# Patient Record
Sex: Male | Born: 1990 | Race: Black or African American | Hispanic: No | Marital: Single | Smoking: Current some day smoker
Health system: Southern US, Community
[De-identification: ages and names within clinical notes are randomized; demographics above are authoritative.]

## PROBLEM LIST (undated history)

## (undated) DIAGNOSIS — B019 Varicella without complication: Secondary | ICD-10-CM

## (undated) HISTORY — DX: Varicella without complication: B01.9

## (undated) HISTORY — PX: NO PAST SURGERIES: SHX2092

---
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
## 2010-05-14 ENCOUNTER — Emergency Department: Payer: Self-pay | Admitting: Emergency Medicine

## 2010-11-01 ENCOUNTER — Emergency Department: Payer: Self-pay | Admitting: Emergency Medicine

## 2011-09-27 ENCOUNTER — Emergency Department: Payer: Self-pay | Admitting: Emergency Medicine

## 2012-08-14 ENCOUNTER — Emergency Department: Payer: Self-pay | Admitting: Emergency Medicine

## 2013-10-17 ENCOUNTER — Emergency Department: Payer: Self-pay | Admitting: Internal Medicine

## 2015-06-10 ENCOUNTER — Emergency Department
Admission: EM | Admit: 2015-06-10 | Discharge: 2015-06-10 | Disposition: A | Discharge status: Home / Self Care | Payer: 59 | Attending: Emergency Medicine | Admitting: Emergency Medicine

## 2015-06-10 ENCOUNTER — Encounter: Payer: Self-pay | Admitting: Emergency Medicine

## 2015-06-10 DIAGNOSIS — R110 Nausea: Secondary | ICD-10-CM | POA: Insufficient documentation

## 2015-06-10 DIAGNOSIS — F17200 Nicotine dependence, unspecified, uncomplicated: Secondary | ICD-10-CM | POA: Insufficient documentation

## 2015-06-10 DIAGNOSIS — R1084 Generalized abdominal pain: Secondary | ICD-10-CM | POA: Diagnosis not present

## 2015-06-10 LAB — URINALYSIS COMPLETE WITH MICROSCOPIC (ARMC ONLY)
BACTERIA UA: NONE SEEN
Bilirubin Urine: NEGATIVE
Glucose, UA: NEGATIVE mg/dL
Hgb urine dipstick: NEGATIVE
Ketones, ur: NEGATIVE mg/dL
Leukocytes, UA: NEGATIVE
NITRITE: NEGATIVE
PROTEIN: NEGATIVE mg/dL
RBC / HPF: NONE SEEN RBC/hpf (ref 0–5)
Specific Gravity, Urine: 1.018 (ref 1.005–1.030)
pH: 6 (ref 5.0–8.0)

## 2015-06-10 LAB — COMPREHENSIVE METABOLIC PANEL
ALBUMIN: 4.3 g/dL (ref 3.5–5.0)
ALT: 26 U/L (ref 17–63)
AST: 20 U/L (ref 15–41)
Alkaline Phosphatase: 71 U/L (ref 38–126)
Anion gap: 6 (ref 5–15)
BILIRUBIN TOTAL: 1.3 mg/dL — AB (ref 0.3–1.2)
BUN: 12 mg/dL (ref 6–20)
CO2: 27 mmol/L (ref 22–32)
CREATININE: 0.81 mg/dL (ref 0.61–1.24)
Calcium: 9.5 mg/dL (ref 8.9–10.3)
Chloride: 105 mmol/L (ref 101–111)
GFR calc Af Amer: >60 mL/min (ref >60)
GFR calc non Af Amer: >60 mL/min (ref >60)
GLUCOSE: 100 mg/dL — AB (ref 65–99)
POTASSIUM: 4 mmol/L (ref 3.5–5.1)
Sodium: 138 mmol/L (ref 135–145)
TOTAL PROTEIN: 7.7 g/dL (ref 6.5–8.1)

## 2015-06-10 LAB — CBC
HEMATOCRIT: 45.5 % (ref 40.0–52.0)
Hemoglobin: 15.2 g/dL (ref 13.0–18.0)
MCH: 27.6 pg (ref 26.0–34.0)
MCHC: 33.5 g/dL (ref 32.0–36.0)
MCV: 82.6 fL (ref 80.0–100.0)
Platelets: 173 10*3/uL (ref 150–440)
RBC: 5.51 MIL/uL (ref 4.40–5.90)
RDW: 13.3 % (ref 11.5–14.5)
WBC: 5.4 10*3/uL (ref 3.8–10.6)

## 2015-06-10 LAB — LIPASE, BLOOD: Lipase: 27 U/L (ref 11–51)

## 2015-06-10 NOTE — ED Notes (Addendum)
Pt to ed with c/o abd pain, n/v/d and back pain x 3 months.  Pt appears in nad, well hydrated and skin warm and dry.

## 2015-06-10 NOTE — Discharge Instructions (Signed)
No certain cause was found for your nausea and abdominal pain, however your examination and evaluation in the emergency department are reassuring.  Return to the emergency department for any new or worsening condition including fever, worsening pain, black or bloody stool, blood in the urine, vomiting, or any other symptoms concerning to you.   Abdominal Pain, Adult Many things can cause belly (abdominal) pain. Most times, the belly pain is not dangerous. Many cases of belly pain can be watched and treated at home. HOME CARE   Do not take medicines that help you go poop (laxatives) unless told to by your doctor.  Only take medicine as told by your doctor.  Eat or drink as told by your doctor. Your doctor will tell you if you should be on a special diet. GET HELP IF:  You do not know what is causing your belly pain.  You have belly pain while you are sick to your stomach (nauseous) or have runny poop (diarrhea).  You have pain while you pee or poop.  Your belly pain wakes you up at night.  You have belly pain that gets worse or better when you eat.  You have belly pain that gets worse when you eat fatty foods.  You have a fever. GET HELP RIGHT AWAY IF:   The pain does not go away within 2 hours.  You keep throwing up (vomiting).  The pain changes and is only in the right or left part of the belly.  You have bloody or tarry looking poop. MAKE SURE YOU:   Understand these instructions.  Will watch your condition.  Will get help right away if you are not doing well or get worse.   This information is not intended to replace advice given to you by your health care provider. Make sure you discuss any questions you have with your health care provider.   Document Released: 12/29/2007 Document Revised: 08/02/2014 Document Reviewed: 03/21/2013 Elsevier Interactive Patient Education [employer]2016 [employer]vier Inc.

## 2015-06-10 NOTE — ED Provider Notes (Signed)
[facility] Emergency Department Provider Note   ____________________________________________  Time seen: 4 PM I have reviewed the triage vital signs and the triage nursing note.  HISTORY  Chief Complaint Abdominal Pain   Historian Patient  HPI Grant Bailey is a 24 y.o. male who is here for evaluation of nausea and generalized abdominal pain after leaving work due to symptoms today. Patient states he has had symptoms for months. He does not have a primary care doctor, and has seen nobody for this yet. He's had no vomiting or diarrhea. He has had no fever. He is not sure if anything aggravates the condition. He is also complaining of some low back bilateral discomfort that is made worse by lifting which she does at his job. No urinary symptoms. No weakness or numbness.    History reviewed. No pertinent past medical history.  There are no active problems to display for this patient.   History reviewed. No pertinent past surgical history.  No current outpatient prescriptions on file.  Allergies Review of patient's allergies indicates no known allergies.  History reviewed. No pertinent family history.  Social History Social History  Substance Use Topics  . Smoking status: Current Every Day Smoker  . Smokeless tobacco: None  . Alcohol Use: No    Review of Systems  Constitutional: Negative for fever. Eyes: Negative for visual changes. ENT: Negative for sore throat. Cardiovascular: Negative for chest pain. Respiratory: Negative for shortness of breath. Gastrointestinal: Negative for  vomiting and diarrhea. Genitourinary: Negative for dysuria. Musculoskeletal: Per history of present illness Skin: Negative for rash. Neurological: Negative for headache. 10 point Review of Systems otherwise negative ____________________________________________   PHYSICAL EXAM:  VITAL SIGNS: ED Triage Vitals  Enc Vitals Group     BP 06/10/15 1507 133/72  mmHg     Pulse Rate 06/10/15 1245 62     Resp 06/10/15 1245 20     Temp 06/10/15 1245 98.6 F (37 C)     Temp Source 06/10/15 1245 Oral     SpO2 06/10/15 1245 97 %     Weight 06/10/15 1245 266 lb (120.657 kg)     Height 06/10/15 1245  (1.676 m)     Head Cir --      Peak Flow --      Pain Score 06/10/15 1246 10     Pain Loc --      Pain Edu? --      Excl. in GC? --      Constitutional: Alert and oriented. Well appearing and in no distress. Eyes: Conjunctivae are normal. PERRL. Normal extraocular movements. ENT   Head: Normocephalic and atraumatic.   Nose: No congestion/rhinnorhea.   Mouth/Throat: Mucous membranes are moist.   Neck: No stridor. Cardiovascular/Chest: Normal rate, regular rhythm.  No murmurs, rubs, or gallops. Respiratory: Normal respiratory effort without tachypnea nor retractions. Breath sounds are clear and equal bilaterally. No wheezes/rales/rhonchi. Gastrointestinal: Soft. No distention, no guarding, no rebound. Nontender, mildly obese.  Genitourinary/rectal:Deferred Musculoskeletal: Nontender with normal range of motion in all extremities. No joint effusions.  No lower extremity tenderness.  No edema. Neurologic:  Normal speech and language. No gross or focal neurologic deficits are appreciated. Skin:  Skin is warm, dry and intact. No rash noted. Psychiatric: Mood and affect are normal. Speech and behavior are normal. Patient exhibits appropriate insight and judgment.  ____________________________________________   EKG I, Governor Rooks, MD, the attending physician have personally viewed and interpreted all ECGs.  None ____________________________________________  LABS (pertinent positives/negatives)  Lipase 27 Comprehensive metabolic panel within normal limits CBC within normal limits Urinalysis normal  ____________________________________________  RADIOLOGY All Xrays were viewed by me. Imaging interpreted by  Radiologist.  None __________________________________________  PROCEDURES  Procedure(s) performed: None  Critical Care performed: None  ____________________________________________   ED COURSE / ASSESSMENT AND PLAN  CONSULTATIONS: None  Pertinent labs & imaging results that were available during my care of the patient were reviewed by me and considered in my medical decision making (see chart for details).   Patient is overall well-appearing with a reassuring examination, vital signs, and laboratory evaluation. His symptoms have been ongoing now for several months, and sound like his abdominal pains and nausea may be related to irritable bowel syndrome. I've asked him to follow with primary care physician, he may end up needing a gastroenterology referral. No imaging indicated from an emergency standpoint.  In terms of his low back pain, this sounds musculoskeletal in nature given the fact that he listened stoops. Conservative management in terms of this.  Patient / Family / Caregiver informed of clinical course, medical decision-making process, and agree with plan.   I discussed return precautions, follow-up instructions, and discharged instructions with patient and/or family.  ___________________________________________   FINAL CLINICAL IMPRESSION(S) / ED DIAGNOSES   Final diagnoses:  Nausea  Abdominal pain, generalized       Governor Rooksebecca Ashlyn Cabler, MD 06/10/15 1630

## 2015-08-18 ENCOUNTER — Emergency Department
Admission: EM | Admit: 2015-08-18 | Discharge: 2015-08-18 | Disposition: A | Discharge status: Home / Self Care | Payer: 59 | Attending: Emergency Medicine | Admitting: Emergency Medicine

## 2015-08-18 ENCOUNTER — Encounter: Payer: Self-pay | Admitting: Emergency Medicine

## 2015-08-18 DIAGNOSIS — R197 Diarrhea, unspecified: Secondary | ICD-10-CM | POA: Diagnosis present

## 2015-08-18 DIAGNOSIS — K529 Noninfective gastroenteritis and colitis, unspecified: Secondary | ICD-10-CM | POA: Insufficient documentation

## 2015-08-18 DIAGNOSIS — F17200 Nicotine dependence, unspecified, uncomplicated: Secondary | ICD-10-CM | POA: Diagnosis not present

## 2015-08-18 LAB — COMPREHENSIVE METABOLIC PANEL
ALK PHOS: 72 U/L (ref 38–126)
ALT: 30 U/L (ref 17–63)
AST: 18 U/L (ref 15–41)
Albumin: 4.9 g/dL (ref 3.5–5.0)
Anion gap: 5 (ref 5–15)
BUN: 9 mg/dL (ref 6–20)
CALCIUM: 9.6 mg/dL (ref 8.9–10.3)
CO2: 31 mmol/L (ref 22–32)
CREATININE: 0.72 mg/dL (ref 0.61–1.24)
Chloride: 103 mmol/L (ref 101–111)
GFR calc non Af Amer: >60 mL/min (ref >60)
Glucose, Bld: 94 mg/dL (ref 65–99)
Potassium: 3.9 mmol/L (ref 3.5–5.1)
SODIUM: 139 mmol/L (ref 135–145)
Total Bilirubin: 1.2 mg/dL (ref 0.3–1.2)
Total Protein: 8 g/dL (ref 6.5–8.1)

## 2015-08-18 LAB — URINALYSIS COMPLETE WITH MICROSCOPIC (ARMC ONLY)
BACTERIA UA: NONE SEEN
BILIRUBIN URINE: NEGATIVE
GLUCOSE, UA: NEGATIVE mg/dL
HGB URINE DIPSTICK: NEGATIVE
KETONES UR: NEGATIVE mg/dL
Leukocytes, UA: NEGATIVE
NITRITE: NEGATIVE
Protein, ur: NEGATIVE mg/dL
RBC / HPF: NONE SEEN RBC/hpf (ref 0–5)
SPECIFIC GRAVITY, URINE: 1.004 — AB (ref 1.005–1.030)
pH: 7 (ref 5.0–8.0)

## 2015-08-18 LAB — CBC WITH DIFFERENTIAL/PLATELET
BASOS PCT: 1 %
Basophils Absolute: 0 10*3/uL (ref 0–0.1)
EOS ABS: 0.1 10*3/uL (ref 0–0.7)
EOS PCT: 1 %
HCT: 46.4 % (ref 40.0–52.0)
Hemoglobin: 15.4 g/dL (ref 13.0–18.0)
Lymphocytes Relative: 37 %
Lymphs Abs: 2.3 10*3/uL (ref 1.0–3.6)
MCH: 27 pg (ref 26.0–34.0)
MCHC: 33.1 g/dL (ref 32.0–36.0)
MCV: 81.5 fL (ref 80.0–100.0)
MONO ABS: 0.4 10*3/uL (ref 0.2–1.0)
MONOS PCT: 6 %
Neutro Abs: 3.5 10*3/uL (ref 1.4–6.5)
Neutrophils Relative %: 55 %
PLATELETS: 186 10*3/uL (ref 150–440)
RBC: 5.7 MIL/uL (ref 4.40–5.90)
RDW: 13.7 % (ref 11.5–14.5)
WBC: 6.3 10*3/uL (ref 3.8–10.6)

## 2015-08-18 LAB — LIPASE, BLOOD: Lipase: 22 U/L (ref 11–51)

## 2015-08-18 MED ORDER — DIPHENOXYLATE-ATROPINE 2.5-0.025 MG PO TABS
2.0000 | ORAL_TABLET | Freq: Once | ORAL | Status: AC
  Administered 2015-08-18: 2 via ORAL

## 2015-08-18 MED ORDER — ONDANSETRON HCL 4 MG/2ML IJ SOLN
4.0000 mg | Freq: Once | INTRAMUSCULAR | Status: AC
  Administered 2015-08-18: 4 mg via INTRAVENOUS
  Filled 2015-08-18: qty 2

## 2015-08-18 MED ORDER — SODIUM CHLORIDE 0.9 % IV BOLUS (SEPSIS)
1000.0000 mL | Freq: Once | INTRAVENOUS | Status: AC
  Administered 2015-08-18: 1000 mL via INTRAVENOUS

## 2015-08-18 MED ORDER — ONDANSETRON 4 MG PO TBDP
4.0000 mg | ORAL_TABLET | Freq: Once | ORAL | Status: AC
  Administered 2015-08-18: 4 mg via ORAL
  Filled 2015-08-18: qty 1

## 2015-08-18 MED ORDER — ONDANSETRON HCL 8 MG PO TABS: 8.0000 mg | ORAL_TABLET | Freq: Three times a day (TID) | ORAL | Status: DC | PRN

## 2015-08-18 NOTE — ED Provider Notes (Signed)
----------------------------------------- [facility] Emergency Department Provider Note  ____________________________________________  Time seen: Approximately 5:33 PM  I have reviewed the triage vital signs and the nursing notes.   HISTORY  Chief Complaint Emesis and Diarrhea    HPI Grant Bailey is a 25 y.o. male assumed care of this patient from Georgia Triplett for complain of generalized abdominal pain.Patient is continue nausea vomiting diarrhea since 08/15/2015. Patient state she cannot tolerate fluids or fluid. Patient was given a fluid challenge after taking OTC Zofran and started vomiting. Patient is awaiting results of urine and blood tests. Patient states his pain has decreased from a 9/10 to 4/10. Initially patient stated the pain was generalized but now states the pain is in epigastric region.   History reviewed. No pertinent past medical history.  There are no active problems to display for this patient.   History reviewed. No pertinent past surgical history.  Current Outpatient Rx  Name  Route  Sig  Dispense  Refill  . ondansetron (ZOFRAN) 8 MG tablet   Oral   Take 1 tablet (8 mg total) by mouth every 8 (eight) hours as needed for nausea or vomiting.   20 tablet   0     Allergies Review of patient's allergies indicates no known allergies.  No family history on file.  Social History Social History  Substance Use Topics  . Smoking status: Current Every Day Smoker  . Smokeless tobacco: None  . Alcohol Use: Yes     Comment: occasional    Review of Systems Constitutional: No fever/chills Eyes: No visual changes. ENT: No sore throat. Cardiovascular: Denies chest pain. Respiratory: Denies shortness of breath. Gastrointestinal: Abdominal pain with nausea vomiting diarrhea No constipation. Genitourinary: Negative for dysuria. Musculoskeletal: Negative for back pain. Skin: Negative for rash. Neurological: Negative for headaches,  focal weakness or numbness. 10-point ROS otherwise negative.  ____________________________________________   PHYSICAL EXAM:  VITAL SIGNS: ED Triage Vitals  Enc Vitals Group     BP 08/18/15 1145 122/79 mmHg     Pulse Rate 08/18/15 1145 67     Resp 08/18/15 1145 18     Temp 08/18/15 1145 98.5 F (36.9 C)     Temp Source 08/18/15 1145 Oral     SpO2 08/18/15 1145 96 %     Weight 08/18/15 1145 260 lb (117.935 kg)     Height 08/18/15 1145  (1.676 m)     Head Cir --      Peak Flow --      Pain Score 08/18/15 1146 9     Pain Loc --      Pain Edu? --      Excl. in GC? --     Constitutional: Alert and oriented. Well appearing and in no acute distress. Eyes: Conjunctivae are normal. PERRL. EOMI. Head: Atraumatic. Nose: No congestion/rhinnorhea. Mouth/Throat: Mucous membranes are moist.  Oropharynx non-erythematous. Neck: No stridor. No cervical spine tenderness to palpation. Hematological/Lymphatic/Immunilogical: No cervical lymphadenopathy. Cardiovascular: Normal rate, regular rhythm. Grossly normal heart sounds.  Good peripheral circulation. Respiratory: Normal respiratory effort.  No retractions. Lungs CTAB. Gastrointestinal: Soft and nontender. No distention. No abdominal bruits. No CVA tenderness. Normoactive bowel sounds Musculoskeletal: No lower extremity tenderness nor edema.  No joint effusions. Neurologic:  Normal speech and language. No gross focal neurologic deficits are appreciated. No gait instability. Skin:  Skin is warm, dry and intact. No rash noted. Psychiatric: Mood and affect are normal. Speech and behavior are normal.  ____________________________________________  LABS (all labs ordered are listed, but only abnormal results are displayed)  Labs Reviewed  URINALYSIS COMPLETEWITH MICROSCOPIC ([facility] ONLY) - Abnormal; Notable for the following:    Color, Urine STRAW (*)    APPearance CLEAR (*)    Specific Gravity, Urine 1.004 (*)    Squamous Epithelial  / LPF 0-5 (*)    All other components within normal limits  CBC WITH DIFFERENTIAL/PLATELET  LIPASE, BLOOD  COMPREHENSIVE METABOLIC PANEL   ____________________________________________  EKG  ____________________________________________  RADIOLOGY   ____________________________________________   PROCEDURES  Procedure(s) performed: None  Critical Care performed: No  ____________________________________________   INITIAL IMPRESSION / ASSESSMENT AND PLAN / ED COURSE  Pertinent labs & imaging results that were available during my care of the patient were reviewed by me and considered in my medical decision making (see chart for details). Viral gastroenteritis  Patient passed food and fluid challenge. Discussed negative labs with patient. Patient given a prescription for Zofran. Patient given discharge care instructions. Patient given a work note for today and tomorrow. ____________________________________________   FINAL CLINICAL IMPRESSION(S) / ED DIAGNOSES  Final diagnoses:  Gastroenteritis      Joni Reining, PA-C 08/18/15 1832  Emily Filbert, MD 08/19/15 [phone]

## 2015-08-18 NOTE — ED Provider Notes (Signed)
[facility] Emergency Department Provider Note ____________________________________________  Time seen: Approximately 2:24 PM  I have reviewed the triage vital signs and the nursing notes.   HISTORY  Chief Complaint Emesis and Diarrhea   HPI Grant Bailey is a 25 y.o. male who presents to the emergency department for generalized abdominal pain that started on Friday night with nausea, vomiting, and diarrhea. He has not vomited since 9 AM this morning, but has not attempted to eat any solid food. He has been able to keep down some sips of water. He states that he has had diarrhea often throughout the day. He describes it as watery, and denies mucus or blood.He describes the abdominal pain as cramping just prior to vomiting.   History reviewed. No pertinent past medical history.  There are no active problems to display for this patient.   History reviewed. No pertinent past surgical history.  No current outpatient prescriptions on file.  Allergies Review of patient's allergies indicates no known allergies.  No family history on file.  Social History Social History  Substance Use Topics  . Smoking status: Current Every Day Smoker  . Smokeless tobacco: None  . Alcohol Use: Yes     Comment: occasional    Review of Systems Constitutional: No fever/chills Eyes: No visual changes. ENT: No sore throat. Cardiovascular: Denies chest pain. Respiratory: Denies shortness of breath. Gastrointestinal: Positive for abdominal pain.  Positive for nausea and vomiting.  Positive for diarrhea.  No constipation. Genitourinary: Negative for dysuria. Musculoskeletal: Negative for back pain. Skin: Negative for rash. Neurological: Negative for headaches, focal weakness or numbness.  10-point ROS otherwise negative.  ____________________________________________   PHYSICAL EXAM:  VITAL SIGNS: ED Triage Vitals  Enc Vitals Group     BP 08/18/15 1145 122/79 mmHg      Pulse Rate 08/18/15 1145 67     Resp 08/18/15 1145 18     Temp 08/18/15 1145 98.5 F (36.9 C)     Temp Source 08/18/15 1145 Oral     SpO2 08/18/15 1145 96 %     Weight 08/18/15 1145 260 lb (117.935 kg)     Height 08/18/15 1145  (1.676 m)     Head Cir --      Peak Flow --      Pain Score 08/18/15 1146 9     Pain Loc --      Pain Edu? --      Excl. in GC? --     Constitutional: Alert and oriented. Well appearing and in no acute distress. Eyes: Conjunctivae are normal. PERRL. EOMI. Head: Atraumatic. Nose: No congestion/rhinnorhea. Mouth/Throat: Mucous membranes are moist.  Oropharynx non-erythematous. Neck: No stridor.   Cardiovascular: Normal rate, regular rhythm. Grossly normal heart sounds.  Good peripheral circulation. Respiratory: Normal respiratory effort.  No retractions. Lungs CTAB. Gastrointestinal: Soft and nontender. No distention. No abdominal bruits. No CVA tenderness. Active bowel sounds 4 quadrants. Musculoskeletal: No lower extremity tenderness nor edema.  No joint effusions. Neurologic:  Normal speech and language. No gross focal neurologic deficits are appreciated. No gait instability. Skin:  Skin is warm, dry and intact. No rash noted. Psychiatric: Mood and affect are normal. Speech and behavior are normal.  ____________________________________________   LABS (all labs ordered are listed, but only abnormal results are displayed)  Labs Reviewed  CBC WITH DIFFERENTIAL/PLATELET  URINALYSIS COMPLETEWITH MICROSCOPIC ([facility] ONLY)  LIPASE, BLOOD  COMPREHENSIVE METABOLIC PANEL   ____________________________________________  EKG   ____________________________________________  RADIOLOGY  ____________________________________________   PROCEDURES  Procedure(s) performed: None  Critical Care performed: No  ____________________________________________   INITIAL IMPRESSION / ASSESSMENT AND PLAN / ED COURSE  Pertinent labs & imaging results  that were available during my care of the patient were reviewed by me and considered in my medical decision making (see chart for details).  ----------------------------------------- 4:12 PM on 08/18/2015 -----------------------------------------  Patient was given Zofran ODT and then by mouth challenge. He vomited after drinking some ice water. We will order labs. Care will be transferred to run Tyro, PA-C at this time. ____________________________________________   FINAL CLINICAL IMPRESSION(S) / ED DIAGNOSES  Final diagnoses:  None      Chinita Pester, FNP 08/18/15 1613  Sharman Cheek, MD 08/19/15 2118

## 2015-08-18 NOTE — ED Notes (Addendum)
Pt was not in room when provider went to assess pt at 1432. Pt is in room when RN checked and provider has been made aware.

## 2015-08-18 NOTE — ED Notes (Signed)
Generalized abd pain starting Friday night with nausea, vomiting and diarrhea.  Last had diarrhea and vomited approx 3 hours ago.

## 2015-09-15 ENCOUNTER — Encounter: Payer: Self-pay | Admitting: Family Medicine

## 2015-09-15 ENCOUNTER — Ambulatory Visit: Payer: 59 | Admitting: Family Medicine

## 2015-09-15 ENCOUNTER — Ambulatory Visit (INDEPENDENT_AMBULATORY_CARE_PROVIDER_SITE_OTHER): Payer: 59 | Admitting: Family Medicine

## 2015-09-15 VITALS — BP 126/82 | HR 61 | Temp 99.1°F | Ht 65.75 in | Wt 263.0 lb

## 2015-09-15 DIAGNOSIS — K921 Melena: Secondary | ICD-10-CM | POA: Diagnosis not present

## 2015-09-15 DIAGNOSIS — G8929 Other chronic pain: Secondary | ICD-10-CM | POA: Diagnosis not present

## 2015-09-15 DIAGNOSIS — R1013 Epigastric pain: Secondary | ICD-10-CM

## 2015-09-15 MED ORDER — DICYCLOMINE HCL 20 MG PO TABS: 20.0000 mg | ORAL_TABLET | Freq: Four times a day (QID) | ORAL | Status: AC

## 2015-09-15 MED ORDER — PANTOPRAZOLE SODIUM 40 MG PO TBEC: 40.0000 mg | DELAYED_RELEASE_TABLET | Freq: Every day | ORAL | Status: AC

## 2015-09-15 NOTE — Patient Instructions (Signed)
Take the medication as prescribed.  We will arrange for you to see GI.  Follow up annually or sooner if needed.   Take care  Dr. Adriana Simas

## 2015-09-15 NOTE — Progress Notes (Signed)
Pre visit review using our clinic review tool, if applicable. No additional management support is needed unless otherwise documented below in the visit note. 

## 2015-09-15 NOTE — Assessment & Plan Note (Signed)
Chronic problem. New problem to me. Exam unremarkable today. Patient has had recent labs that were normal. History suggestive of irritable bowel syndrome. Treating for potential gastritis/GERD with Protonix. Given dicyclomine for abdominal pain. Patient would like to be referred to GI. Will place a referral. Stool cards given.

## 2015-09-15 NOTE — Progress Notes (Signed)
Subjective:  Patient ID: Grant Bailey, male    DOB: January 29, 1991  Age: 25 y.o. MRN: [id number]  CC: Abdominal pain  HPI Grant Bailey is a 25 y.o. male presents to the clinic today with complaints of abdominal pain.  Abdominal pain  State that this has been going on intermittently for the past year.  Pain is in the epigastric region primarily.  Is diffuse when pain is severe.  Pain is currently mild at 3/10 in severity.  He reports associated nausea, vomiting, diarrhea and constipation intermittently with the abdominal pain.  He's also had a history of hematochezia. He had a small amount of blood approximately week ago.  No associated fever or chills.  Patient currently drinks alcohol approximately 3 times a week. 1-4 beers on occasion.  No reports of chronic NSAID use.  No known exacerbating or relieving factors.  PMH, Surgical Hx, Family Hx, Social History reviewed and updated as below.  Past Medical History  Diagnosis Date  . Chicken pox    Past Surgical History  Procedure Laterality Date  . No past surgeries     Family History  Problem Relation Age of Onset  . Diabetes Mother    Social History  Substance Use Topics  . Smoking status: Current Every Day Smoker  . Smokeless tobacco: Never Used  . Alcohol Use: 1.8 oz/week    3 Standard drinks or equivalent per week     Comment: occasional    Review of Systems  Gastrointestinal: Positive for nausea, vomiting, abdominal pain, diarrhea, constipation and blood in stool.  Endocrine:       Excessive thirst.   Neurological: Positive for dizziness, weakness and headaches.  All other systems reviewed and are negative.  Objective:   Today's Vitals: BP 126/82 mmHg  Pulse 61  Temp(Src) 99.1 F (37.3 C) (Oral)  Ht 5' 5.75" (1.67 m)  Wt 263 lb (119.296 kg)  BMI 42.78 kg/m2  SpO2 98%  Physical Exam  Constitutional: He is oriented to person, place, and time. He appears well-developed and well-nourished. No  distress.  Obese.  HENT:  Head: Normocephalic and atraumatic.  Mouth/Throat: Oropharynx is clear and moist.  Oropharynx clear.  Bleeding noted around teeth/gums (patient states he had a dental exam earlier today).  Eyes: Conjunctivae are normal. No scleral icterus.  Neck: Neck supple.  Cardiovascular: Normal rate and regular rhythm.   No murmur heard. Pulmonary/Chest: Effort normal and breath sounds normal. He has no wheezes. He has no rales.  Abdominal: Soft.  Nondistended.  Mild epigastric tenderness to palpation. No rebound or guarding.   Musculoskeletal: Normal range of motion. He exhibits no edema.  Lymphadenopathy:    He has no cervical adenopathy.  Neurological: He is alert and oriented to person, place, and time.  Skin: Skin is warm and dry. No rash noted.  Psychiatric: He has a normal mood and affect.  Vitals reviewed.   Assessment & Plan:   Problem List Items Addressed This Visit    Abdominal pain, chronic, epigastric - Primary    Chronic problem. New problem to me. Exam unremarkable today. Patient has had recent labs that were normal. History suggestive of irritable bowel syndrome. Treating for potential gastritis/GERD with Protonix. Given dicyclomine for abdominal pain. Patient would like to be referred to GI. Will place a referral. Stool cards given.       Other Visit Diagnoses    Hematochezia        Relevant Orders    Fecal occult blood,  imunochemical      Outpatient Encounter Prescriptions as of 09/15/2015  Medication Sig  . dicyclomine (BENTYL) 20 MG tablet Take 1 tablet (20 mg total) by mouth every 6 (six) hours.  . pantoprazole (PROTONIX) 40 MG tablet Take 1 tablet (40 mg total) by mouth daily.  . [DISCONTINUED] ondansetron (ZOFRAN) 8 MG tablet Take 1 tablet (8 mg total) by mouth every 8 (eight) hours as needed for nausea or vomiting.   No facility-administered encounter medications on file as of 09/15/2015.   Follow-up: PRN  Everlene Other  DO [facility]

## 2015-09-16 ENCOUNTER — Encounter: Payer: Self-pay | Admitting: Internal Medicine

## 2015-09-17 ENCOUNTER — Ambulatory Visit: Payer: 59 | Admitting: Family Medicine

## 2015-10-03 ENCOUNTER — Other Ambulatory Visit: Payer: Self-pay | Admitting: *Deleted

## 2015-10-03 DIAGNOSIS — K921 Melena: Secondary | ICD-10-CM

## 2015-10-07 ENCOUNTER — Other Ambulatory Visit (INDEPENDENT_AMBULATORY_CARE_PROVIDER_SITE_OTHER): Payer: 59

## 2015-10-07 ENCOUNTER — Other Ambulatory Visit: Payer: Self-pay | Admitting: *Deleted

## 2015-10-07 DIAGNOSIS — Z139 Encounter for screening, unspecified: Secondary | ICD-10-CM

## 2015-10-07 LAB — FECAL OCCULT BLOOD, IMMUNOCHEMICAL: Fecal Occult Bld: NEGATIVE

## 2015-10-09 ENCOUNTER — Ambulatory Visit: Payer: 59 | Admitting: Internal Medicine

## 2015-12-01 ENCOUNTER — Encounter: Payer: Self-pay | Admitting: Internal Medicine

## 2015-12-01 ENCOUNTER — Ambulatory Visit (INDEPENDENT_AMBULATORY_CARE_PROVIDER_SITE_OTHER): Payer: 59 | Admitting: Internal Medicine

## 2015-12-01 VITALS — BP 110/78 | HR 72 | Ht 66.0 in | Wt 266.0 lb

## 2015-12-01 DIAGNOSIS — K648 Other hemorrhoids: Secondary | ICD-10-CM | POA: Diagnosis not present

## 2015-12-01 DIAGNOSIS — R1013 Epigastric pain: Secondary | ICD-10-CM

## 2015-12-01 DIAGNOSIS — G8929 Other chronic pain: Secondary | ICD-10-CM | POA: Diagnosis not present

## 2015-12-01 NOTE — Patient Instructions (Addendum)
You have been given a separate informational sheet regarding your tobacco use, the importance of quitting and local resources to help you quit.    You have been scheduled for an endoscopy. Please follow written instructions given to you at your visit today. If you use inhalers (even only as needed), please bring them with you on the day of your procedure.    Today you have been given a hemorrhoid handout to read and follow.    I appreciate the opportunity to care for you.

## 2015-12-01 NOTE — Progress Notes (Signed)
  Referred by Dr. Everlene OtherJayce Bailey Subjective:    Patient ID: Grant Bailey, male    DOB: 03/29/1991, 25 y.o.   MRN: [id number] Cc: epigastric pain HPI Previously healthy 25 yo AA man with 8-12 months epigastric and bilateral upper quadrant pains - can be sharp and debilitating. He says no food or movement triggers though some mild pain w/ twisting and lifting. Some nausea. Saw PCP in early 2017 - started PPI and dicyclomine but not helping. He says rare nocturnal sxs.  Also some occasional irregular bowel movements. A few months ago had self limited bright red blood per rectum - subsequent guaiacs negative. He had been exercising and lifting weights trying to lose weight prior to this but has since stopped that since birth of daughter.  No prior hx similar problems.  No unintentional weight loss.  All other GI ROS negative  Says he has been under stress but did not elaborate beyoud "life"  Medications, allergies, past medical history, past surgical history, family history and social history are reviewed and updated in the EMR.  Review of Systems Mild low back pain All other ROS negative    Objective:   Physical Exam @BP  110/78 mmHg  Pulse 72  Ht 5\' 6"  (1.676 m)  Wt 266 lb (120.657 kg)  BMI 42.95 kg/m2@  General:  Well-developed, well-nourished and in no acute distress Eyes:  anicteric. ENT:   Mouth and posterior pharynx free of lesions.  Neck:   supple w/o thyromegaly or mass.  Lungs: Clear to auscultation bilaterally. Heart:  S1S2, no rubs, murmurs, gallops. Abdomen:  soft, non-tender, no hepatosplenomegaly, hernia, or mass and BS+.  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 Extremities:   no edema, cyanosis or clubbing Skin   no rash. Neuro:  A&O x 3.  Psych:  appropriate mood and  Affect.   Data Reviewed: PCP  notes, labs 2017 CBC and CMEt NL      Assessment & Plan:   Encounter Diagnoses  Name Primary?  . Abdominal pain, chronic, epigastric Yes  . Hemorrhoids, internal, with bleeding    EGD to investigate chronic PPI-refractory pain The risks and benefits as well as alternatives of endoscopic procedure(s) have been discussed and reviewed. All questions answered. The patient agrees to proceed.  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 ZH:YQMVHCc:Grant Berton LanG Cook, DO

## 2015-12-01 NOTE — Assessment & Plan Note (Signed)
EGD Not responsive to PPI ? functional

## 2015-12-05 ENCOUNTER — Ambulatory Visit (INDEPENDENT_AMBULATORY_CARE_PROVIDER_SITE_OTHER): Payer: 59 | Admitting: Family Medicine

## 2015-12-05 ENCOUNTER — Encounter: Payer: Self-pay | Admitting: Family Medicine

## 2015-12-05 VITALS — BP 108/68 | HR 69 | Temp 98.5°F | Wt 269.2 lb

## 2015-12-05 DIAGNOSIS — M545 Low back pain, unspecified: Secondary | ICD-10-CM | POA: Insufficient documentation

## 2015-12-05 MED ORDER — CYCLOBENZAPRINE HCL 10 MG PO TABS
10.0000 mg | ORAL_TABLET | Freq: Three times a day (TID) | ORAL | Status: AC | PRN
Start: 2015-12-05 — End: ?

## 2015-12-05 MED ORDER — TRAMADOL HCL 50 MG PO TABS: 50.0000 mg | ORAL_TABLET | Freq: Three times a day (TID) | ORAL | Status: AC | PRN

## 2015-12-05 NOTE — Progress Notes (Signed)
Pre visit review using our clinic review tool, if applicable. No additional management support is needed unless otherwise documented below in the visit note. 

## 2015-12-05 NOTE — Patient Instructions (Signed)
Your back pain is from the lifting.  Take it easy over the weekend and use the medication as prescribed.  Follow-up as needed.  Congratulations on the new baby  Take care  Dr. Adriana Simas  Low Back Sprain With Rehab A sprain is an injury in which a ligament is torn. The ligaments of the lower back are vulnerable to sprains. However, they are strong and require great force to be injured. These ligaments are important for stabilizing the spinal column. Sprains are classified into three categories. Grade 1 sprains cause pain, but the tendon is not lengthened. Grade 2 sprains include a lengthened ligament, due to the ligament being stretched or partially ruptured. With grade 2 sprains there is still function, although the function may be decreased. Grade 3 sprains involve a complete tear of the tendon or muscle, and function is usually impaired. SYMPTOMS   Severe pain in the lower back.  Sometimes, a feeling of a "pop," "snap," or tear, at the time of injury.  Tenderness and sometimes swelling at the injury site.  Uncommonly, bruising (contusion) within 48 hours of injury.  Muscle spasms in the back. CAUSES  Low back sprains occur when a force is placed on the ligaments that is greater than they can handle. Common causes of injury include:  Performing a stressful act while off-balance.  Repetitive stressful activities that involve movement of the lower back.  Direct hit (trauma) to the lower back. RISK INCREASES WITH:  Contact sports (football, wrestling).  Collisions (major skiing accidents).  Sports that require throwing or lifting (baseball, weightlifting).  Sports involving twisting of the spine (gymnastics, diving, tennis, golf).  Poor strength and flexibility.  Inadequate protection.  Previous back injury or surgery (especially fusion). PREVENTION  Wear properly fitted and padded protective equipment.  Warm up and stretch properly before activity.  Allow for adequate  recovery between workouts.  Maintain physical fitness:  Strength, flexibility, and endurance.  Cardiovascular fitness.  Maintain a healthy body weight. PROGNOSIS  If treated properly, low back sprains usually heal with non-surgical treatment. The length of time for healing depends on the severity of the injury.  RELATED COMPLICATIONS   Recurring symptoms, resulting in a chronic problem.  Chronic inflammation and pain in the low back.  Delayed healing or resolution of symptoms, especially if activity is resumed too soon.  Prolonged impairment.  Unstable or arthritic joints of the low back. TREATMENT  Treatment first involves the use of ice and medicine, to reduce pain and inflammation. The use of strengthening and stretching exercises may help reduce pain with activity. These exercises may be performed at home or with a therapist. Severe injuries may require referral to a therapist for further evaluation and treatment, such as ultrasound. Your caregiver may advise that you wear a back brace or corset, to help reduce pain and discomfort. Often, prolonged bed rest results in greater harm then benefit. Corticosteroid injections may be recommended. However, these should be reserved for the most serious cases. It is important to avoid using your back when lifting objects. At night, sleep on your back on a firm mattress, with a pillow placed under your knees. If non-surgical treatment is unsuccessful, surgery may be needed.  MEDICATION   If pain medicine is needed, nonsteroidal anti-inflammatory medicines (aspirin and ibuprofen), or other minor pain relievers (acetaminophen), are often advised.  Do not take pain medicine for 7 days before surgery.  Prescription pain relievers may be given, if your caregiver thinks they are needed. Use only as  directed and only as much as you need.  Ointments applied to the skin may be helpful.  Corticosteroid injections may be given by your caregiver.  These injections should be reserved for the most serious cases, because they may only be given a certain number of times. HEAT AND COLD  Cold treatment (icing) should be applied for 10 to 15 minutes every 2 to 3 hours for inflammation and pain, and immediately after activity that aggravates your symptoms. Use ice packs or an ice massage.  Heat treatment may be used before performing stretching and strengthening activities prescribed by your caregiver, physical therapist, or athletic trainer. Use a heat pack or a warm water soak. SEEK MEDICAL CARE IF:   Symptoms get worse or do not improve in 2 to 4 weeks, despite treatment.  You develop numbness or weakness in either leg.  You lose bowel or bladder function.  Any of the following occur after surgery: fever, increased pain, swelling, redness, drainage of fluids, or bleeding in the affected area.  New, unexplained symptoms develop. (Drugs used in treatment may produce side effects.) EXERCISES  RANGE OF MOTION (ROM) AND STRETCHING EXERCISES - Low Back Sprain Most people with lower back pain will find that their symptoms get worse with excessive bending forward (flexion) or arching at the lower back (extension). The exercises that will help resolve your symptoms will focus on the opposite motion.  Your physician, physical therapist or athletic trainer will help you determine which exercises will be most helpful to resolve your lower back pain. Do not complete any exercises without first consulting with your caregiver. Discontinue any exercises which make your symptoms worse, until you speak to your caregiver. If you have pain, numbness or tingling which travels down into your buttocks, leg or foot, the goal of the therapy is for these symptoms to move closer to your back and eventually resolve. Sometimes, these leg symptoms will get better, but your lower back pain may worsen. This is often an indication of progress in your rehabilitation. Be very  alert to any changes in your symptoms and the activities in which you participated in the 24 hours prior to the change. Sharing this information with your caregiver will allow him or her to most efficiently treat your condition. These exercises may help you when beginning to rehabilitate your injury. Your symptoms may resolve with or without further involvement from your physician, physical therapist or athletic trainer. While completing these exercises, remember:   Restoring tissue flexibility helps normal motion to return to the joints. This allows healthier, less painful movement and activity.  An effective stretch should be held for at least 30 seconds.  A stretch should never be painful. You should only feel a gentle lengthening or release in the stretched tissue. FLEXION RANGE OF MOTION AND STRETCHING EXERCISES: STRETCH - Flexion, Single Knee to Chest   Lie on a firm bed or floor with both legs extended in front of you.  Keeping one leg in contact with the floor, bring your opposite knee to your chest. Hold your leg in place by either grabbing behind your thigh or at your knee.  Pull until you feel a gentle stretch in your low back. Hold __________ seconds.  Slowly release your grasp and repeat the exercise with the opposite side. Repeat __________ times. Complete this exercise __________ times per day.  STRETCH - Flexion, Double Knee to Chest  Lie on a firm bed or floor with both legs extended in front of you.  Keeping one leg in contact with the floor, bring your opposite knee to your chest.  Tense your stomach muscles to support your back and then lift your other knee to your chest. Hold your legs in place by either grabbing behind your thighs or at your knees.  Pull both knees toward your chest until you feel a gentle stretch in your low back. Hold __________ seconds.  Tense your stomach muscles and slowly return one leg at a time to the floor. Repeat __________ times.  Complete this exercise __________ times per day.  STRETCH - Low Trunk Rotation  Lie on a firm bed or floor. Keeping your legs in front of you, bend your knees so they are both pointed toward the ceiling and your feet are flat on the floor.  Extend your arms out to the side. This will stabilize your upper body by keeping your shoulders in contact with the floor.  Gently and slowly drop both knees together to one side until you feel a gentle stretch in your low back. Hold for __________ seconds.  Tense your stomach muscles to support your lower back as you bring your knees back to the starting position. Repeat the exercise to the other side. Repeat __________ times. Complete this exercise __________ times per day  EXTENSION RANGE OF MOTION AND FLEXIBILITY EXERCISES: STRETCH - Extension, Prone on Elbows   Lie on your stomach on the floor, a bed will be too soft. Place your palms about shoulder width apart and at the height of your head.  Place your elbows under your shoulders. If this is too painful, stack pillows under your chest.  Allow your body to relax so that your hips drop lower and make contact more completely with the floor.  Hold this position for __________ seconds.  Slowly return to lying flat on the floor. Repeat __________ times. Complete this exercise __________ times per day.  RANGE OF MOTION - Extension, Prone Press Ups  Lie on your stomach on the floor, a bed will be too soft. Place your palms about shoulder width apart and at the height of your head.  Keeping your back as relaxed as possible, slowly straighten your elbows while keeping your hips on the floor. You may adjust the placement of your hands to maximize your comfort. As you gain motion, your hands will come more underneath your shoulders.  Hold this position __________ seconds.  Slowly return to lying flat on the floor. Repeat __________ times. Complete this exercise __________ times per day.  RANGE OF  MOTION- Quadruped, Neutral Spine   Assume a hands and knees position on a firm surface. Keep your hands under your shoulders and your knees under your hips. You may place padding under your knees for comfort.  Drop your head and point your tailbone toward the ground below you. This will round out your lower back like an angry cat. Hold this position for __________ seconds.  Slowly lift your head and release your tail bone so that your back sags into a large arch, like an old horse.  Hold this position for __________ seconds.  Repeat this until you feel limber in your low back.  Now, find your "sweet spot." This will be the most comfortable position somewhere between the two previous positions. This is your neutral spine. Once you have found this position, tense your stomach muscles to support your low back.  Hold this position for __________ seconds. Repeat __________ times. Complete this exercise __________ times per day.  STRENGTHENING EXERCISES - Low Back Sprain These exercises may help you when beginning to rehabilitate your injury. These exercises should be done near your "sweet spot." This is the neutral, low-back arch, somewhere between fully rounded and fully arched, that is your least painful position. When performed in this safe range of motion, these exercises can be used for people who have either a flexion or extension based injury. These exercises may resolve your symptoms with or without further involvement from your physician, physical therapist or athletic trainer. While completing these exercises, remember:   Muscles can gain both the endurance and the strength needed for everyday activities through controlled exercises.  Complete these exercises as instructed by your physician, physical therapist or athletic trainer. Increase the resistance and repetitions only as guided.  You may experience muscle soreness or fatigue, but the pain or discomfort you are trying to eliminate  should never worsen during these exercises. If this pain does worsen, stop and make certain you are following the directions exactly. If the pain is still present after adjustments, discontinue the exercise until you can discuss the trouble with your caregiver. STRENGTHENING - Deep Abdominals, Pelvic Tilt   Lie on a firm bed or floor. Keeping your legs in front of you, bend your knees so they are both pointed toward the ceiling and your feet are flat on the floor.  Tense your lower abdominal muscles to press your low back into the floor. This motion will rotate your pelvis so that your tail bone is scooping upwards rather than pointing at your feet or into the floor. With a gentle tension and even breathing, hold this position for __________ seconds. Repeat __________ times. Complete this exercise __________ times per day.  STRENGTHENING - Abdominals, Crunches   Lie on a firm bed or floor. Keeping your legs in front of you, bend your knees so they are both pointed toward the ceiling and your feet are flat on the floor. Cross your arms over your chest.  Slightly tip your chin down without bending your neck.  Tense your abdominals and slowly lift your trunk high enough to just clear your shoulder blades. Lifting higher can put excessive stress on the lower back and does not further strengthen your abdominal muscles.  Control your return to the starting position. Repeat __________ times. Complete this exercise __________ times per day.  STRENGTHENING - Quadruped, Opposite UE/LE Lift   Assume a hands and knees position on a firm surface. Keep your hands under your shoulders and your knees under your hips. You may place padding under your knees for comfort.  Find your neutral spine and gently tense your abdominal muscles so that you can maintain this position. Your shoulders and hips should form a rectangle that is parallel with the floor and is not twisted.  Keeping your trunk steady, lift your  right hand no higher than your shoulder and then your left leg no higher than your hip. Make sure you are not holding your breath. Hold this position for __________ seconds.  Continuing to keep your abdominal muscles tense and your back steady, slowly return to your starting position. Repeat with the opposite arm and leg. Repeat __________ times. Complete this exercise __________ times per day.  STRENGTHENING - Abdominals and Quadriceps, Straight Leg Raise   Lie on a firm bed or floor with both legs extended in front of you.  Keeping one leg in contact with the floor, bend the other knee so that your foot can rest flat  on the floor.  Find your neutral spine, and tense your abdominal muscles to maintain your spinal position throughout the exercise.  Slowly lift your straight leg off the floor about 6 inches for a count of 15, making sure to not hold your breath.  Still keeping your neutral spine, slowly lower your leg all the way to the floor. Repeat this exercise with each leg __________ times. Complete this exercise __________ times per day. POSTURE AND BODY MECHANICS CONSIDERATIONS - Low Back Sprain Keeping correct posture when sitting, standing or completing your activities will reduce the stress put on different body tissues, allowing injured tissues a chance to heal and limiting painful experiences. The following are general guidelines for improved posture. Your physician or physical therapist will provide you with any instructions specific to your needs. While reading these guidelines, remember:  The exercises prescribed by your provider will help you have the flexibility and strength to maintain correct postures.  The correct posture provides the best environment for your joints to work. All of your joints have less wear and tear when properly supported by a spine with good posture. This means you will experience a healthier, less painful body.  Correct posture must be practiced with  all of your activities, especially prolonged sitting and standing. Correct posture is as important when doing repetitive low-stress activities (typing) as it is when doing a single heavy-load activity (lifting). RESTING POSITIONS Consider which positions are most painful for you when choosing a resting position. If you have pain with flexion-based activities (sitting, bending, stooping, squatting), choose a position that allows you to rest in a less flexed posture. You would want to avoid curling into a fetal position on your side. If your pain worsens with extension-based activities (prolonged standing, working overhead), avoid resting in an extended position such as sleeping on your stomach. Most people will find more comfort when they rest with their spine in a more neutral position, neither too rounded nor too arched. Lying on a non-sagging bed on your side with a pillow between your knees, or on your back with a pillow under your knees will often provide some relief. Keep in mind, being in any one position for a prolonged period of time, no matter how correct your posture, can still lead to stiffness. PROPER SITTING POSTURE In order to minimize stress and discomfort on your spine, you must sit with correct posture. Sitting with good posture should be effortless for a healthy body. Returning to good posture is a gradual process. Many people can work toward this most comfortably by using various supports until they have the flexibility and strength to maintain this posture on their own. When sitting with proper posture, your ears will fall over your shoulders and your shoulders will fall over your hips. You should use the back of the chair to support your upper back. Your lower back will be in a neutral position, just slightly arched. You may place a small pillow or folded towel at the base of your lower back for  support.  When working at a desk, create an environment that supports good, upright posture.  Without extra support, muscles tire, which leads to excessive strain on joints and other tissues. Keep these recommendations in mind: CHAIR:  A chair should be able to slide under your desk when your back makes contact with the back of the chair. This allows you to work closely.  The chair's height should allow your eyes to be level with the upper part  of your monitor and your hands to be slightly lower than your elbows. BODY POSITION  Your feet should make contact with the floor. If this is not possible, use a foot rest.  Keep your ears over your shoulders. This will reduce stress on your neck and low back. INCORRECT SITTING POSTURES  If you are feeling tired and unable to assume a healthy sitting posture, do not slouch or slump. This puts excessive strain on your back tissues, causing more damage and pain. Healthier options include:  Using more support, like a lumbar pillow.  Switching tasks to something that requires you to be upright or walking.  Talking a brief walk.  Lying down to rest in a neutral-spine position. PROLONGED STANDING WHILE SLIGHTLY LEANING FORWARD  When completing a task that requires you to lean forward while standing in one place for a long time, place either foot up on a stationary 2-4 inch high object to help maintain the best posture. When both feet are on the ground, the lower back tends to lose its slight inward curve. If this curve flattens (or becomes too large), then the back and your other joints will experience too much stress, tire more quickly, and can cause pain. CORRECT STANDING POSTURES Proper standing posture should be assumed with all daily activities, even if they only take a few moments, like when brushing your teeth. As in sitting, your ears should fall over your shoulders and your shoulders should fall over your hips. You should keep a slight tension in your abdominal muscles to brace your spine. Your tailbone should point down to the ground, not  behind your body, resulting in an over-extended swayback posture.  INCORRECT STANDING POSTURES  Common incorrect standing postures include a forward head, locked knees and/or an excessive swayback. WALKING Walk with an upright posture. Your ears, shoulders and hips should all line-up. PROLONGED ACTIVITY IN A FLEXED POSITION When completing a task that requires you to bend forward at your waist or lean over a low surface, try to find a way to stabilize 3 out of 4 of your limbs. You can place a hand or elbow on your thigh or rest a knee on the surface you are reaching across. This will provide you more stability, so that your muscles do not tire as quickly. By keeping your knees relaxed, or slightly bent, you will also reduce stress across your lower back. CORRECT LIFTING TECHNIQUES DO :  Assume a wide stance. This will provide you more stability and the opportunity to get as close as possible to the object which you are lifting.  Tense your abdominals to brace your spine. Bend at the knees and hips. Keeping your back locked in a neutral-spine position, lift using your leg muscles. Lift with your legs, keeping your back straight.  Test the weight of unknown objects before attempting to lift them.  Try to keep your elbows locked down at your sides in order get the best strength from your shoulders when carrying an object.  Always ask for help when lifting heavy or awkward objects. INCORRECT LIFTING TECHNIQUES DO NOT:   Lock your knees when lifting, even if it is a small object.  Bend and twist. Pivot at your feet or move your feet when needing to change directions.  Assume that you can safely pick up even a paperclip without proper posture.   This information is not intended to replace advice given to you by your health care provider. Make sure you discuss any questions  you have with your health care provider.   Document Released: 07/12/2005 Document Revised: 08/02/2014 Document Reviewed:  10/24/2008 [employer] Interactive Patient Education [employer].

## 2015-12-05 NOTE — Assessment & Plan Note (Signed)
New problem. MSK in nature from lifting. No red flags on history on exam. Treating with Flexeril and tramadol as needed. NSAIDs were not given as patient has ongoing issues with epigastric pain.

## 2015-12-05 NOTE — Progress Notes (Signed)
Subjective:  Patient ID: Grant Bailey, male    DOB: 07/12/1991  Age: 25 y.o. MRN: [id number]  CC: Back pain  HPI:  25 year old male presents for an acute visit with complaints of back pain.  Back pain  Patient reports a 3 week history of low back pain.  Pain is intermittently severe.  He describes it as throbbing and sharp.  He states that it is most severe it is 10/10.  Worse with activity.  Relieved by rest.  He also been taking Tylenol and aspirin with little relief.  He states that this is likely been a result of frequent lifting at work. He states that he lifts boxes all day long. No other reported fall, trauma, injury.  He denies any radicular symptoms, saddle anesthesia, bowel or bladder incontinence.   Social Hx   Social History   Social History  . Marital Status: Single    Spouse Name: N/A  . Number of Children: 1  . Years of Education: N/A   Occupational History  . Warehouse    Social History Main Topics  . Smoking status: Current Some Day Smoker  . Smokeless tobacco: Never Used  . Alcohol Use: 1.8 oz/week    3 Standard drinks or equivalent per week     Comment: occasional  . Drug Use: No  . Sexual Activity: Not Asked   Other Topics Concern  . None   Social History Narrative   Single, 1 daughter born 08/2015   Lives with mother of daughter   Works [employer] [place], [place]   12/01/2015      Review of Systems  Constitutional: Negative.   Musculoskeletal: Positive for back pain.   Objective:  BP 108/68 mmHg  Pulse 69  Temp(Src) 98.5 F (36.9 C) (Oral)  Wt 269 lb 4 oz (122.131 kg)  SpO2 97%  BP/Weight 12/05/2015 12/01/2015 09/15/2015  Systolic BP 108 110 126  Diastolic BP 68 78 82  Wt. (Lbs) 269.25 266 263  BMI 43.48 42.95 42.78   Physical Exam  Constitutional: He is oriented to person, place, and time. He appears well-developed. No distress.  Cardiovascular: Normal rate and regular rhythm.   Pulmonary/Chest: Effort  normal and breath sounds normal. He has no wheezes. He has no rales.  Musculoskeletal:  Low back - mild tenderness to palpation in the paraspinal musculature. Negative straight leg raise.  Neurological: He is alert and oriented to person, place, and time.  Vitals reviewed.  Lab Results  Component Value Date   WBC 6.3 08/18/2015   HGB 15.4 08/18/2015   HCT 46.4 08/18/2015   PLT 186 08/18/2015   GLUCOSE 94 08/18/2015   ALT 30 08/18/2015   AST 18 08/18/2015   NA 139 08/18/2015   K 3.9 08/18/2015   CL 103 08/18/2015   CREATININE 0.72 08/18/2015   BUN 9 08/18/2015   CO2 31 08/18/2015    Assessment & Plan:   Problem List Items Addressed This Visit    Low back pain - Primary    New problem. MSK in nature from lifting. No red flags on history on exam. Treating with Flexeril and tramadol as needed. NSAIDs were not given as patient has ongoing issues with epigastric pain.      Relevant Medications   traMADol (ULTRAM) 50 MG tablet   cyclobenzaprine (FLEXERIL) 10 MG tablet      Meds ordered this encounter  Medications  . traMADol (ULTRAM) 50 MG tablet    Sig: Take 1  tablet (50 mg total) by mouth every 8 (eight) hours as needed.    Dispense:  30 tablet    Refill:  0  . cyclobenzaprine (FLEXERIL) 10 MG tablet    Sig: Take 1 tablet (10 mg total) by mouth 3 (three) times daily as needed for muscle spasms.    Dispense:  30 tablet    Refill:  0    Follow-up: PRN  Everlene Other DO [facility]

## 2015-12-08 ENCOUNTER — Encounter: Payer: Self-pay | Admitting: Internal Medicine

## 2015-12-08 ENCOUNTER — Ambulatory Visit (AMBULATORY_SURGERY_CENTER): Payer: 59 | Admitting: Internal Medicine

## 2015-12-08 VITALS — BP 117/63 | HR 57 | Temp 97.3°F | Resp 21 | Ht 66.0 in | Wt 266.0 lb

## 2015-12-08 DIAGNOSIS — R1013 Epigastric pain: Secondary | ICD-10-CM

## 2015-12-08 MED ORDER — SODIUM CHLORIDE 0.9 % IV SOLN: 500.0000 mL | INTRAVENOUS | Status: DC

## 2015-12-08 NOTE — Progress Notes (Signed)
Called to room to assist during endoscopic procedure.  Patient ID and intended procedure confirmed with present staff. Received instructions for my participation in the procedure from the performing physician.  

## 2015-12-08 NOTE — Progress Notes (Signed)
Stable to RR 

## 2015-12-08 NOTE — Op Note (Signed)
[facility] Patient Name: Grant Bailey Procedure Date: 12/08/2015 3:05 PM MRN: [id number] Endoscopist: Iva Booparl E Gessner , MD Age: 25 Referring MD:  Date of Birth: 11/11/1990 Gender: Male Procedure:                Upper GI endoscopy Indications:              Epigastric abdominal pain Medicines:                Propofol per Anesthesia, Monitored Anesthesia Care Procedure:                Pre-Anesthesia Assessment:                           - Prior to the procedure, a History and Physical                            was performed, and patient medications and                            allergies were reviewed. The patient's tolerance of                            previous anesthesia was also reviewed. The risks                            and benefits of the procedure and the sedation                            options and risks were discussed with the patient.                            All questions were answered, and informed consent                            was obtained. Prior Anticoagulants: The patient has                            taken no previous anticoagulant or antiplatelet                            agents. ASA Grade Assessment: II - A patient with                            mild systemic disease. After reviewing the risks                            and benefits, the patient was deemed in                            satisfactory condition to undergo the procedure.                           After obtaining informed consent, the endoscope was  passed under direct vision. Throughout the                            procedure, the patient's blood pressure, pulse, and                            oxygen saturations were monitored continuously. The                            Model GIF-[id number]) scope was introduced                            through the mouth, and advanced to the second part                            of duodenum. The upper GI  endoscopy was                            accomplished without difficulty. The patient                            tolerated the procedure well. Scope In: Scope Out: Findings:                 Patchy mildly erythematous mucosa without bleeding                            was found in the prepyloric region of the stomach.                            Biopsies were taken with a cold forceps for                            histology. Verification of patient identification                            for the specimen was done. Estimated blood loss was                            minimal.                           The exam was otherwise without abnormality.                           The cardia and gastric fundus were normal on                            retroflexion. Complications:            No immediate complications. Estimated Blood Loss:     Estimated blood loss was minimal. Impression:               - Erythematous mucosa in the prepyloric region of  the stomach. Biopsied.                           - The examination was otherwise normal. Recommendation:           - Patient has a contact number available for                            emergencies. The signs and symptoms of potential                            delayed complications were discussed with the                            patient. Return to normal activities tomorrow.                            Written discharge instructions were provided to the                            patient.                           - Resume previous diet.                           - Continue present medications.                           - Await pathology results. Iva Boop, MD 12/08/2015 3:41:05 PM This report has been signed electronically.

## 2015-12-08 NOTE — Patient Instructions (Addendum)
Mostly normal but slight redness in the stomach - I took biopsies to see if infection there. Will call - hopefully by Friday.  I appreciate the opportunity to care for you. Iva Boop, MD, FACG YOU HAD AN ENDOSCOPIC PROCEDURE TODAY AT THE [facility]:   Refer to the procedure report that was given to you for any specific questions about what was found during the examination.  If the procedure report does not answer your questions, please call your gastroenterologist to clarify.  If you requested that your care partner not be given the details of your procedure findings, then the procedure report has been included in a sealed envelope for you to review at your convenience later.  YOU SHOULD EXPECT: Some feelings of bloating in the abdomen. Passage of more gas than usual.  Walking can help get rid of the air that was put into your GI tract during the procedure and reduce the bloating. If you had a lower endoscopy (such as a colonoscopy or flexible sigmoidoscopy) you may notice spotting of blood in your stool or on the toilet paper. If you underwent a bowel prep for your procedure, you may not have a normal bowel movement for a few days.  Please Note:  You might notice some irritation and congestion in your nose or some drainage.  This is from the oxygen used during your procedure.  There is no need for concern and it should clear up in a day or so.  SYMPTOMS TO REPORT IMMEDIATELY:    Following upper endoscopy (EGD)  Vomiting of blood or coffee ground material  New chest pain or pain under the shoulder blades  Painful or persistently difficult swallowing  New shortness of breath  Fever of 100F or higher  Black, tarry-looking stools  For urgent or emergent issues, a gastroenterologist can be reached at any hour by calling [phone].   DIET: Your first meal following the procedure should be a small meal and then it is ok to progress to your normal diet. Heavy or  fried foods are harder to digest and may make you feel nauseous or bloated.  Likewise, meals heavy in dairy and vegetables can increase bloating.  Drink plenty of fluids but you should avoid alcoholic beverages for 24 hours.  ACTIVITY:  You should plan to take it easy for the rest of today and you should NOT DRIVE or use heavy machinery until tomorrow (because of the sedation medicines used during the test).    FOLLOW UP: Our staff will call the number listed on your records the next business day following your procedure to check on you and address any questions or concerns that you may have regarding the information given to you following your procedure. If we do not reach you, we will leave a message.  However, if you are feeling well and you are not experiencing any problems, there is no need to return our call.  We will assume that you have returned to your regular daily activities without incident.  If any biopsies were taken you will be contacted by phone or by letter within the next 1-3 weeks.  Please call us at [phone] if you have not heard about the biopsies in 3 weeks.    SIGNATURES/CONFIDENTIALITY: You and/or your care partner have signed paperwork which will be entered into your electronic medical record.  These signatures attest to the fact that that the information above on your After Visit Summary has been reviewed and  is understood.  Full responsibility of the confidentiality of this discharge information lies with you and/or your care-partner.  Wait biopsy results.  Continue medications.

## 2015-12-09 ENCOUNTER — Telehealth: Payer: Self-pay | Admitting: *Deleted

## 2015-12-09 NOTE — Telephone Encounter (Signed)
  Follow up Call-  Call back number 12/08/2015  Post procedure Call Back phone  # [phone]  Permission to leave phone message Yes     Patient questions:  Do you have a fever, pain , or abdominal swelling? No. Pain Score  0 *  Have you tolerated food without any problems? Yes.    Have you been able to return to your normal activities? Yes.    Do you have any questions about your discharge instructions: Diet   No. Medications  No. Follow up visit  No.  Do you have questions or concerns about your Care? No.  Actions: * If pain score is 4 or above: No action needed, pain <4.

## 2015-12-12 ENCOUNTER — Other Ambulatory Visit: Payer: Self-pay

## 2015-12-12 DIAGNOSIS — R1084 Generalized abdominal pain: Secondary | ICD-10-CM

## 2015-12-12 NOTE — Progress Notes (Signed)
Quick Note:  The biopsies are ok - no problems with these I recommend abdominal ultrasound to evaluate bilateral upper abdominal pain He may want to do in [place] which is ok [facility])  [facility] no letter/recall ______

## 2015-12-18 ENCOUNTER — Ambulatory Visit
Admission: RE | Admit: 2015-12-18 | Discharge: 2015-12-18 | Disposition: A | Discharge status: Home / Self Care | Payer: 59 | Source: Ambulatory Visit | Attending: Gastroenterology | Admitting: Gastroenterology

## 2015-12-18 DIAGNOSIS — R1084 Generalized abdominal pain: Secondary | ICD-10-CM | POA: Diagnosis not present

## 2015-12-18 NOTE — Progress Notes (Signed)
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

## 2015-12-19 ENCOUNTER — Other Ambulatory Visit: Payer: Self-pay

## 2015-12-19 MED ORDER — MELOXICAM 15 MG PO TABS: 15.0000 mg | ORAL_TABLET | Freq: Every day | ORAL | Status: AC

## 2017-03-07 ENCOUNTER — Emergency Department
Admission: EM | Admit: 2017-03-07 | Discharge: 2017-03-07 | Disposition: A | Discharge status: Home / Self Care | Payer: Self-pay | Attending: Emergency Medicine | Admitting: Emergency Medicine

## 2017-03-07 ENCOUNTER — Emergency Department: Payer: Self-pay

## 2017-03-07 DIAGNOSIS — G8929 Other chronic pain: Secondary | ICD-10-CM | POA: Insufficient documentation

## 2017-03-07 DIAGNOSIS — M25561 Pain in right knee: Secondary | ICD-10-CM

## 2017-03-07 DIAGNOSIS — F17210 Nicotine dependence, cigarettes, uncomplicated: Secondary | ICD-10-CM | POA: Insufficient documentation

## 2017-03-07 DIAGNOSIS — Z79899 Other long term (current) drug therapy: Secondary | ICD-10-CM | POA: Insufficient documentation

## 2017-03-07 DIAGNOSIS — M1711 Unilateral primary osteoarthritis, right knee: Secondary | ICD-10-CM | POA: Insufficient documentation

## 2017-03-07 MED ORDER — NABUMETONE 750 MG PO TABS: 750.0000 mg | ORAL_TABLET | Freq: Two times a day (BID) | ORAL | 1 refills | Status: AC

## 2017-03-07 NOTE — ED Provider Notes (Signed)
[facility] Emergency Department Provider Note ____________________________________________  Time seen: 2007  I have reviewed the triage vital signs and the nursing notes.  HISTORY  Chief Complaint  Knee Pain  HPI Grant Bailey is a 26 y.o. male This is a the ED for evaluation management of intermittent right knee pain has been chronic over the last 10 years. Patient describes a sports related injury some 10 years prior, but denies any surgical intervention or any diagnosis of true internal derangement. Over the years he is experiencing intermittent catching, clicking, locking of the knee. His most recent episode was 2 days ago, when he had a near give way experience in the knee. He had swelling to the knee and pain with weightbearing. He has not taken any medications in the interim for symptom relief. He has applied ice compresses to help reduce some of the swelling.  Past Medical History:  Diagnosis Date  . Chicken pox     Patient Active Problem List   Diagnosis Date Noted  . Low back pain 12/05/2015  . Abdominal pain, chronic, epigastric 09/15/2015    Past Surgical History:  Procedure Laterality Date  . NO PAST SURGERIES      Prior to Admission medications   Medication Sig Start Date End Date Taking? Authorizing Provider  cyclobenzaprine (FLEXERIL) 10 MG tablet Take 1 tablet (10 mg total) by mouth 3 (three) times daily as needed for muscle spasms. 12/05/15   Tommie Samsook, Jayce G, DO  dicyclomine (BENTYL) 20 MG tablet Take 1 tablet (20 mg total) by mouth every 6 (six) hours. 09/15/15   Tommie Samsook, Jayce G, DO  meloxicam (MOBIC) 15 MG tablet Take 1 tablet (15 mg total) by mouth daily. 12/19/15   Iva BoopGessner, Carl E, MD  nabumetone (RELAFEN) 750 MG tablet Take 1 tablet (750 mg total) by mouth 2 (two) times daily. 03/07/17   Laurice Kimmons, Charlesetta IvoryJenise V Bacon, PA-C  pantoprazole (PROTONIX) 40 MG tablet Take 1 tablet (40 mg total) by mouth daily. 09/15/15   Tommie Samsook, Jayce G, DO  traMADol  (ULTRAM) 50 MG tablet Take 1 tablet (50 mg total) by mouth every 8 (eight) hours as needed. 12/05/15   Tommie Samsook, Jayce G, DO    Allergies Patient has no known allergies.  Family History  Problem Relation Age of Onset  . Diabetes Mother     Social History Social History  Substance Use Topics  . Smoking status: Current Some Day Smoker    Types: Cigars  . Smokeless tobacco: Never Used  . Alcohol use 1.8 oz/week    3 Standard drinks or equivalent per week     Comment: occasional    Review of Systems  Constitutional: Negative for fever. Cardiovascular: Negative for chest pain. Respiratory: Negative for shortness of breath. Musculoskeletal: Negative for back pain. Right knee pain as above. Skin: Negative for rash. Neurological: Negative for headaches, focal weakness or numbness. ____________________________________________  PHYSICAL EXAM:  VITAL SIGNS: ED Triage Vitals  Enc Vitals Group     BP 03/07/17 1946 (!) 142/67     Pulse Rate 03/07/17 1946 71     Resp 03/07/17 1946 16     Temp 03/07/17 1946 98.8 F (37.1 C)     Temp Source 03/07/17 1946 Oral     SpO2 03/07/17 1946 99 %     Weight 03/07/17 1947 280 lb (127 kg)     Height 03/07/17 1947 5\' 6"  (1.676 m)     Head Circumference --      Peak  Flow --      Pain Score 03/07/17 1946 10     Pain Loc --      Pain Edu? --      Excl. in GC? --     Constitutional: Alert and oriented. Well appearing and in no distress. Head: Normocephalic and atraumatic. Cardiovascular: Normal rate, regular rhythm. Normal distal pulses. Respiratory: Normal respiratory effort.  Musculoskeletal: Right knee without any obvious deformity, dislocation, or effusion. Patient normal active range of motion without crepitus. Normal patellar tracking without ballottement. Patella palpation to the medial joint line. No valgus or varus joint stress this patient. Negative achilles tenderness is noted. Nontender with normal range of motion in all extremities.   Neurologic:  Normal gait without ataxia. Normal speech and language. No gross focal neurologic deficits are appreciated. ____________________________________________   RADIOLOGY  Right Knee IMPRESSION: Mild but age advanced osteoarthritis  I, Kinzy Weyers, Charlesetta Ivory, personally viewed and evaluated these images (plain radiographs) as part of my medical decision making, as well as reviewing the written report by the radiologist. ____________________________________________  INITIAL IMPRESSION / ASSESSMENT AND PLAN / ED COURSE  Patient with x-ray evidence of mild medial compartment and patellofemoral compartment joint space narrowing, subchondral sclerosis, and osteophyte formation. No acute fracture or dislocation is appreciated. He is discharged with a prescription for Relafen the dose as needed for joint pain relief. He should follow with orthopedics for intermittent knee pain evaluation. A work note for 1 day is provided as requested. ____________________________________________  FINAL CLINICAL IMPRESSION(S) / ED DIAGNOSES  Final diagnoses:  Chronic pain of right knee  Primary osteoarthritis of right knee      Karmen Stabs, Charlesetta Ivory, PA-C 03/07/17 2235    Sharman Cheek, MD 03/07/17 2330

## 2017-03-07 NOTE — Discharge Instructions (Signed)
Your x-ray shows some mild arthritis and bone spurs. Take the prescription meds as directed. Consider using a knee sleeve for support. Follow-up with Dr. Ernest PineHooten as needed.

## 2017-03-07 NOTE — ED Notes (Signed)
Pt discharged to home.  Family member driving.  Discharge instructions reviewed.  Verbalized understanding.  No questions or concerns at this time.  Teach back verified.  Pt in NAD.  No items left in ED.   

## 2017-03-07 NOTE — ED Triage Notes (Signed)
Pt presents to ED via POV with c/o RIGHT knee pain x2-3 weeks. Pt denies any recent injury or trauma; reports injuring the same knee "in high school 10 years ago". No redness, warmth, or swelling noted. Pt with even and steady gait, no difficulties with ambulation observed. Pt is A&O, in NAD; RR even, regular, and unlabored.

## 2017-12-22 IMAGING — CR DG KNEE COMPLETE 4+V*R*
4 series · 4 of 4 positions shown · non-contrast
Comparison: 10/17/2013

CLINICAL DATA: Intermittent knee pain.  Locking and giving away.

EXAM:
RIGHT KNEE - COMPLETE 4+ VIEW

[knee ap]
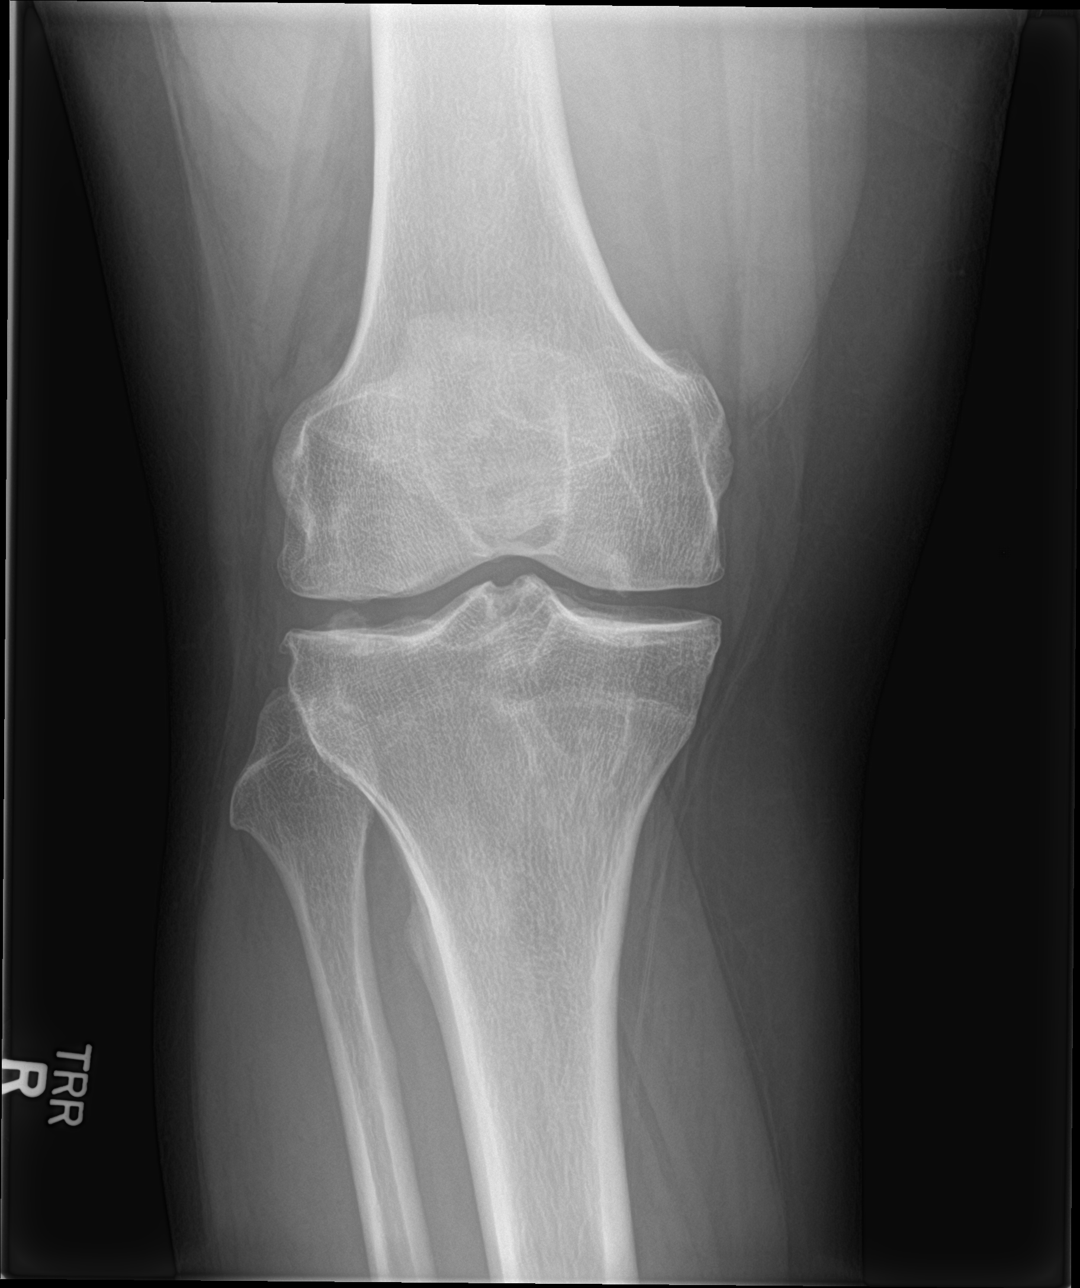

[knee obl (1 of 2)]
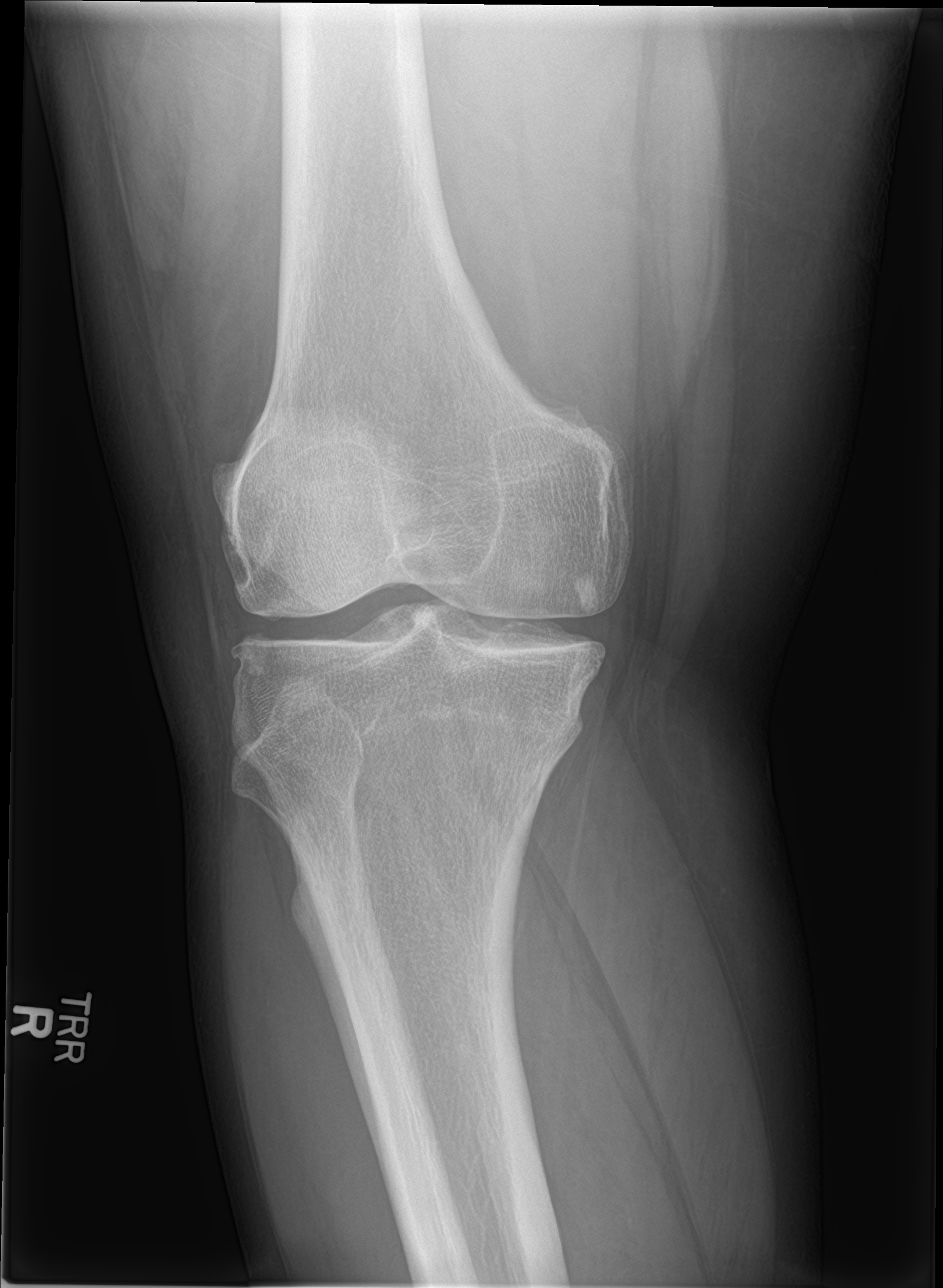

[knee obl (2 of 2)]
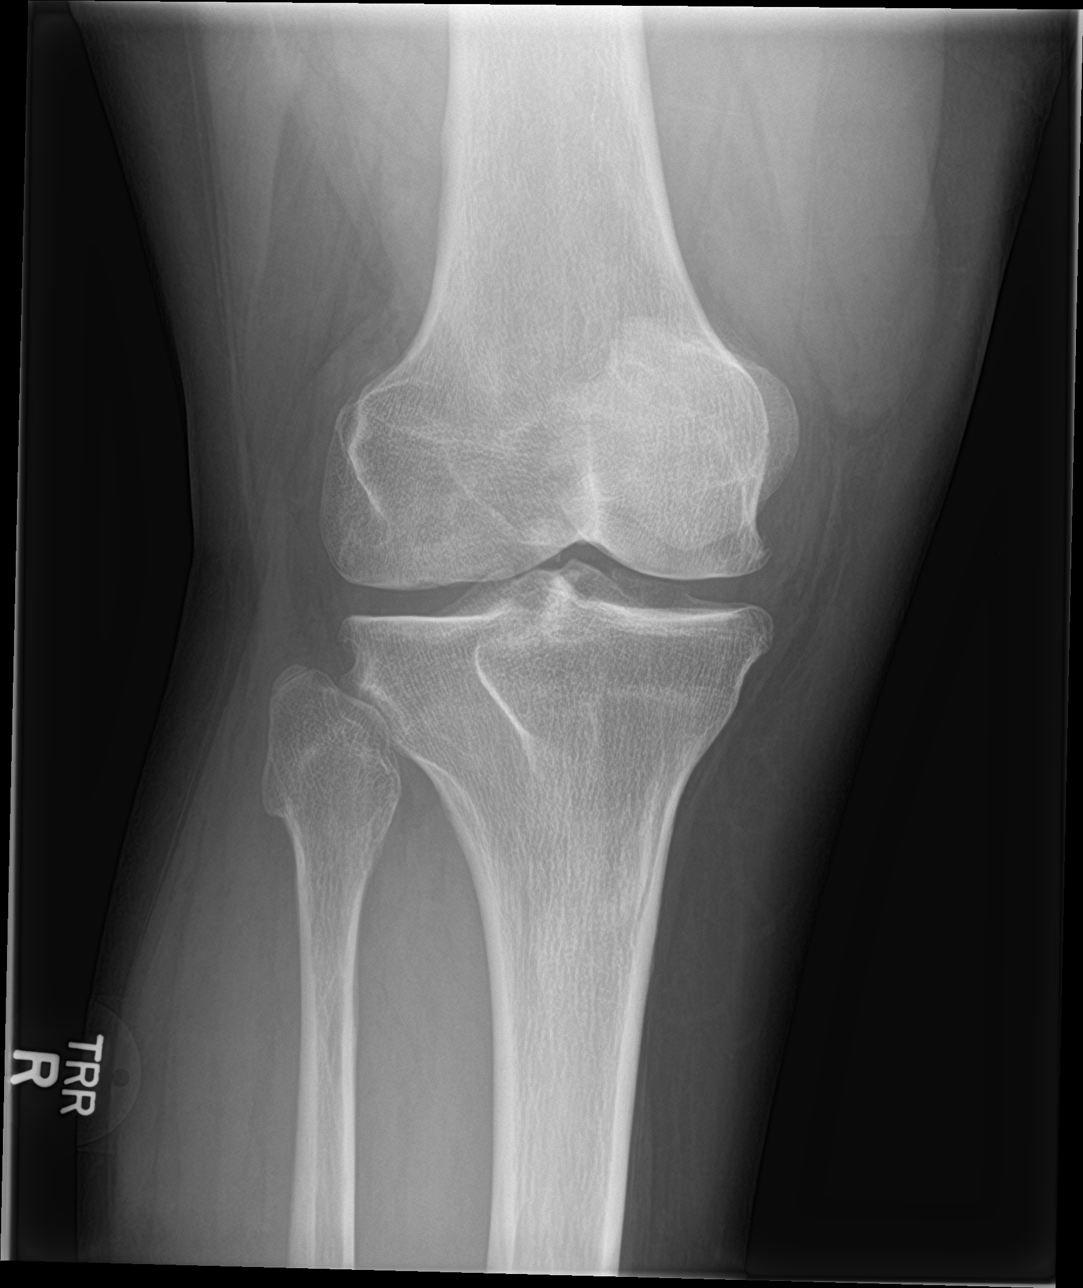

[knee lat]
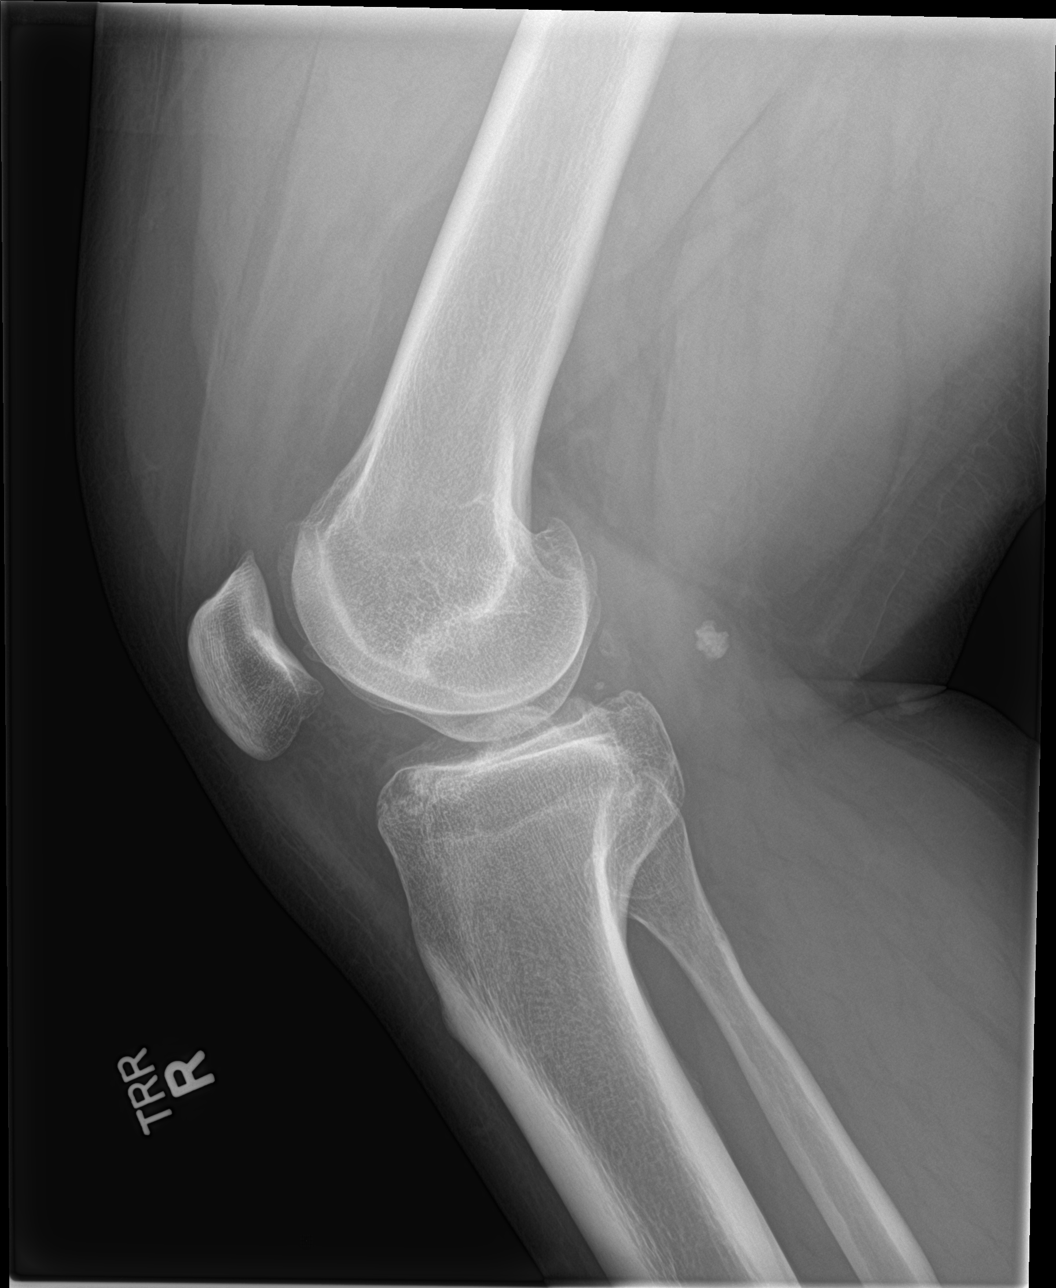

[4 of 4 positions shown; findings below may reference images not displayed]

FINDINGS: Mild medial compartment and patellofemoral compartment joint space
narrowing with subchondral sclerosis and osteophyte formation. No
joint effusion.
IMPRESSION: Mild but age advanced osteoarthritis.

## 2019-06-28 ENCOUNTER — Other Ambulatory Visit: Payer: Self-pay

## 2019-06-28 DIAGNOSIS — Z20822 Contact with and (suspected) exposure to covid-19: Secondary | ICD-10-CM

## 2019-07-01 LAB — NOVEL CORONAVIRUS, NAA: SARS-CoV-2, NAA: NOT DETECTED
